# Patient Record
Sex: Female | Born: 2005 | Race: Black or African American | Hispanic: No | Marital: Single | Smoking: Never smoker
Health system: Southern US, Community
[De-identification: ages and names within clinical notes are randomized; demographics above are authoritative.]

## PROBLEM LIST (undated history)

## (undated) DIAGNOSIS — J45909 Unspecified asthma, uncomplicated: Secondary | ICD-10-CM

## (undated) HISTORY — DX: Unspecified asthma, uncomplicated: J45.909

## (undated) HISTORY — PX: TONSILLECTOMY: SUR1361

---
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
## 2023-01-02 ENCOUNTER — Emergency Department (HOSPITAL_BASED_OUTPATIENT_CLINIC_OR_DEPARTMENT_OTHER)
Admission: EM | Admit: 2023-01-02 | Discharge: 2023-01-02 | Disposition: A | Discharge status: Home / Self Care | Payer: Medicaid Other | Attending: Emergency Medicine | Admitting: Emergency Medicine

## 2023-01-02 ENCOUNTER — Encounter (HOSPITAL_BASED_OUTPATIENT_CLINIC_OR_DEPARTMENT_OTHER): Payer: Self-pay

## 2023-01-02 ENCOUNTER — Other Ambulatory Visit: Payer: Self-pay

## 2023-01-02 DIAGNOSIS — R109 Unspecified abdominal pain: Secondary | ICD-10-CM | POA: Insufficient documentation

## 2023-01-02 DIAGNOSIS — R000 Tachycardia, unspecified: Secondary | ICD-10-CM | POA: Insufficient documentation

## 2023-01-02 DIAGNOSIS — Z049 Encounter for examination and observation for unspecified reason: Secondary | ICD-10-CM

## 2023-01-02 DIAGNOSIS — Z0389 Encounter for observation for other suspected diseases and conditions ruled out: Secondary | ICD-10-CM | POA: Diagnosis not present

## 2023-01-02 LAB — CBC WITH DIFFERENTIAL/PLATELET
Abs Immature Granulocytes: 0.02 10*3/uL (ref 0.00–0.07)
Basophils Absolute: 0 10*3/uL (ref 0.0–0.1)
Basophils Relative: 0 %
Eosinophils Absolute: 0.1 10*3/uL (ref 0.0–0.5)
Eosinophils Relative: 1 %
HCT: 38.8 % (ref 36.0–46.0)
Hemoglobin: 12.9 g/dL (ref 12.0–15.0)
Immature Granulocytes: 0 %
Lymphocytes Relative: 25 %
Lymphs Abs: 1.8 10*3/uL (ref 0.7–4.0)
MCH: 26.8 pg (ref 26.0–34.0)
MCHC: 33.2 g/dL (ref 30.0–36.0)
MCV: 80.7 fL (ref 80.0–100.0)
Monocytes Absolute: 0.7 10*3/uL (ref 0.1–1.0)
Monocytes Relative: 10 %
Neutro Abs: 4.5 10*3/uL (ref 1.7–7.7)
Neutrophils Relative %: 64 %
Platelets: 273 10*3/uL (ref 150–400)
RBC: 4.81 MIL/uL (ref 3.87–5.11)
RDW: 13.7 % (ref 11.5–15.5)
WBC: 7.2 10*3/uL (ref 4.0–10.5)
nRBC: 0 % (ref 0.0–0.2)

## 2023-01-02 NOTE — Discharge Instructions (Addendum)
There was no evidence of any elevated antiparasitic white blood cells in your blood which significantly lowers my suspicion that you have an active parasite infection.  If you continue to have any concern I would try to obtain a stool sample for testing, you can return to the emergency department with stool sample or follow-up with your primary care doctor

## 2023-01-02 NOTE — ED Provider Notes (Signed)
[facility] Provider Note   CSN: [id number] Arrival date & time: 01/02/23  1518     History  Chief Complaint  Patient presents with   Foreign Body    Janet Mcconnell is a 17 y.o. female with noncontributory past medical history presents with concern for possible worm in her stool.  Patient has a photo on her phone of stool with a small linear mass that patient reports was suspicious for a parasite, patient denies any travel to [place] country.  She reports she did have sushi but it has been around a month.  She reports that she occasionally has some abdominal cramping but no severe abdominal pain.   Foreign Body      Home Medications Prior to Admission medications   Medication Sig Start Date End Date Taking? Authorizing Provider  albuterol (VENTOLIN HFA) 108 (90 Base) MCG/ACT inhaler ProAir HFA 90 mcg/actuation aerosol inhaler  2 puffs via inhalation 15-30 minutes prior to exercise and q4-6H PRN for wheezing, asthma symptoms.    [provider]  medroxyPROGESTERone (DEPO-PROVERA) 150 MG/ML injection Inject 1 mL (150 mg total) into the muscle every 3 (three) months. 12/07/22   Shelly Bombard, MD  medroxyPROGESTERone Acetate 150 MG/ML SUSY INJECT 1ML INTRAMUSCULARLY ONCE EVERY 3 MONTHS 03/22/22   Gavin Pound, CNM  Prenat-Fe Poly-Methfol-FA-DHA (VITAFOL ULTRA) 29-0.6-0.4-200 MG CAPS Take 1 capsule by mouth daily before breakfast. 12/07/22   Shelly Bombard, MD  triamcinolone cream (KENALOG) 0.1 % triamcinolone acetonide 0.1 % topical cream    [provider]      Allergies    Patient has no known allergies.    Review of Systems   Review of Systems  All other systems reviewed and are negative.   Physical Exam Updated Vital Signs BP 124/72   Pulse (!) 107   Temp 98 F (36.7 C)   Resp 18   Ht 5\' 6"  (1.676 m)   Wt 49.9 kg   LMP 12/05/2022 (Exact Date)   SpO2 100%   BMI 17.75 kg/m  Physical Exam Vitals  and nursing note reviewed.  Constitutional:      General: She is not in acute distress.    Appearance: Normal appearance.  HENT:     Head: Normocephalic and atraumatic.  Eyes:     General:        Right eye: No discharge.        Left eye: No discharge.  Cardiovascular:     Rate and Rhythm: Regular rhythm. Tachycardia present.     Heart sounds: No murmur heard.    No friction rub. No gallop.  Pulmonary:     Effort: Pulmonary effort is normal.     Breath sounds: Normal breath sounds.  Abdominal:     General: Bowel sounds are normal.     Palpations: Abdomen is soft.  Skin:    General: Skin is warm and dry.     Capillary Refill: Capillary refill takes less than 2 seconds.  Neurological:     Mental Status: She is alert and oriented to person, place, and time.  Psychiatric:        Mood and Affect: Mood normal.        Behavior: Behavior normal.     ED Results / Procedures / Treatments   Labs (all labs ordered are listed, but only abnormal results are displayed) Labs Reviewed  OVA + PARASITE EXAM  CBC WITH DIFFERENTIAL/PLATELET    EKG None  Radiology  No results found.  Procedures Procedures    Medications Ordered in ED Medications - No data to display  ED Course/ Medical Decision Making/ A&P                             Medical Decision Making Amount and/or Complexity of Data Reviewed Labs: ordered.   This patient is a 17 y.o. female who presents to the ED for concern of worms in stool.   Differential diagnoses prior to evaluation: Normal appearance of stool within variation of regular production,New parasitic infection, versus other intra-abdominal infection  Past Medical History / Social History / Additional history: Chart reviewed. Pertinent results include: Patient without any recent foreign travel, reports remote history of eating sushi around 1 month ago  Physical Exam: Physical exam performed. The pertinent findings include: Patient mildly  tachycardic on arrival, improved on my reevaluation, repeat heart rate 89.  Normal rhythm.  Patient was not able to provide a stool sample in the emergency department.  Independently interpreted CBC with differential which shows normal eosinophil count  Medications / Treatment: Discussed with patient that I have an overall very low clinical suspicion that she has a new parasitic infection based only on the appearance of her stool that she has very little to no risk factors for developing 1, she has no eosinophilia  Disposition: After consideration of the diagnostic results and the patients response to treatment, I feel that patient does not require acute treatment with albendazole, encouraged her to follow-up with her PCP or return if she has ongoing suspicion for parasitic infection of the stool with a stool sample, but would recommend reassurance, and no treatment at this time.   emergency department workup does not suggest an emergent condition requiring admission or immediate intervention beyond what has been performed at this time. The plan is: as above. The patient is safe for discharge and has been instructed to return immediately for worsening symptoms, change in symptoms or any other concerns.  Final Clinical Impression(s) / ED Diagnoses Final diagnoses:  Suspected condition not found    Rx / DC Orders ED Discharge Orders     None         Dorien Chihuahua 01/02/23 1702    Hayden Rasmussen, MD 01/03/23 1005

## 2023-01-02 NOTE — ED Notes (Signed)
Patient unable to give stool sample at this time. PA, Christian aware.

## 2023-01-02 NOTE — ED Triage Notes (Signed)
Patient here POV from Home.  Endorses Today at 1300 she used the BR and had a regular BM when she noted a worm.  Some nausea in the AM at times. No Emesis or Diarrhea.   NAD Noted during Triage. A&Ox4. GCS 15. Ambulatory.

## 2023-01-02 NOTE — ED Notes (Signed)
Reviewed AVS/discharge instruction with patient. Time allotted for and all questions answered. Patient is agreeable for d/c and escorted to ed exit by staff.  

## 2023-01-17 ENCOUNTER — Ambulatory Visit: Payer: Medicaid Other | Admitting: Nurse Practitioner

## 2023-02-28 ENCOUNTER — Ambulatory Visit (INDEPENDENT_AMBULATORY_CARE_PROVIDER_SITE_OTHER): Payer: Medicaid Other

## 2023-02-28 DIAGNOSIS — Z1339 Encounter for screening examination for other mental health and behavioral disorders: Secondary | ICD-10-CM | POA: Diagnosis not present

## 2023-02-28 DIAGNOSIS — Z3042 Encounter for surveillance of injectable contraceptive: Secondary | ICD-10-CM | POA: Diagnosis not present

## 2023-02-28 MED ORDER — MEDROXYPROGESTERONE ACETATE 150 MG/ML IM SUSY
150.0000 mg | PREFILLED_SYRINGE | Freq: Once | INTRAMUSCULAR | Status: AC
  Administered 2023-02-28: 150 mg via INTRAMUSCULAR

## 2023-02-28 NOTE — Progress Notes (Signed)
Date last pap: N/A. Last Depo-Provera: 12/07/22. Side Effects if any: N/A. Serum HCG indicated? N/A. Depo-Provera 150 mg IM given by: Samantha Olivera B.,RN. Injection given in RUOQ. Patient tolerated well. Next appointment due July 31- Aug 14.   Office supply given. Pharmacy on back order

## 2023-03-06 ENCOUNTER — Ambulatory Visit (INDEPENDENT_AMBULATORY_CARE_PROVIDER_SITE_OTHER): Payer: Medicaid Other | Admitting: Licensed Clinical Social Worker

## 2023-03-06 DIAGNOSIS — F419 Anxiety disorder, unspecified: Secondary | ICD-10-CM | POA: Diagnosis not present

## 2023-03-08 NOTE — BH Specialist Note (Signed)
Integrated Behavioral Health via Telemedicine Visit  03/08/2023 Janet Mcconnell [id number]  Number of Integrated Behavioral Health Clinician visits:1 Session Start time:  1030am Session End time: 1106am Total time in minutes: 36 mins via mychart video   Referring Provider: Rozanna Box RN Patient/Family location: Home  [facility] Provider location: [facility]  All persons participating in visit: Pt Janet Mcconnell and LCSW A Mumtaz Lovins  Types of Service: Individual psychotherapy and Video visit  I connected with Janet Mcconnell and/or Janet Mcconnell'Janet n/a via  Telephone or Video Enabled Telemedicine Application  (Video is Caregility application) and verified that I am speaking with the correct person using two identifiers. Discussed confidentiality: Yes   I discussed the limitations of telemedicine and the availability of in person appointments.  Discussed there is a possibility of technology failure and discussed alternative modes of communication if that failure occurs.  I discussed that engaging in this telemedicine visit, they consent to the provision of behavioral healthcare and the services will be billed under their insurance.  Patient and/or legal guardian expressed understanding and consented to Telemedicine visit: Yes   Presenting Concerns: Patient and/or family reports the following symptoms/concerns: anxiety Duration of problem: approx one year; Severity of problem: mild  Patient and/or Family'Janet Strengths/Protective Factors: Concrete supports in place (healthy food, safe environments, etc.)  Goals Addressed: Patient will:  Reduce symptoms of: anxiety   Increase knowledge and/or ability of: coping skills   Demonstrate ability to: Increase healthy adjustment to current life circumstances  Progress towards Goals: Ongoing  Interventions: Interventions utilized:  Motivational Interviewing and Supportive Counseling Standardized Assessments completed: PHQ 9  Patient and/or Family Response:  Janet Mcconnell lives in [place] with mother and 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 Janet Mcconnell reports feeling overwhelmed, crying, overthinking, negative thought patterns. LCSW A Felton Clinton and Janet Lyall discussed mindfulness, CBT and self care techniques to alleviate mentioned anxiety symptoms.  Assessment: Patient currently experiencing anxiety.   Patient may benefit from integrated behavioral health.  Plan: Follow up with behavioral health clinician on : two weeks via mychart  Behavioral recommendations: Mindfulness, self care techniques, prioritize task, decrease social media encounters.  Referral(Janet): Integrated [employer] (In Clinic)  I discussed the assessment and treatment plan with the patient and/or parent/guardian. They were provided an opportunity to ask questions and all were answered. They agreed with the plan and demonstrated an understanding of the instructions.   They were advised to call back or seek an in-person evaluation if the symptoms worsen or if the condition fails to improve as anticipated.  Gwyndolyn Saxon, LCSW

## 2023-03-27 ENCOUNTER — Encounter: Payer: Self-pay | Admitting: Licensed Clinical Social Worker

## 2023-03-30 ENCOUNTER — Encounter: Payer: Medicaid Other | Admitting: Licensed Clinical Social Worker

## 2023-04-03 ENCOUNTER — Ambulatory Visit (INDEPENDENT_AMBULATORY_CARE_PROVIDER_SITE_OTHER): Payer: Medicaid Other | Admitting: Licensed Clinical Social Worker

## 2023-04-03 DIAGNOSIS — F419 Anxiety disorder, unspecified: Secondary | ICD-10-CM | POA: Diagnosis not present

## 2023-04-10 NOTE — BH Specialist Note (Signed)
Integrated Behavioral Health via Telemedicine Visit  04/10/2023 Janet Mcconnell [id number]  Number of Integrated Behavioral Health Clinician visits: 2 Session Start time:  1045am Session End time: 11:10am Total time in minutes: 25 mins via mychart video   Referring Provider: Rozanna Box RN Patient/Family location: Home  [facility] Provider location: [facility]  All persons participating in visit: Janet Mcconnell and LCSW A Hilmer Aliberti  Types of Service: Individual psychotherapy and Video visit  I connected with Janet Mcconnell and/or Janet Mcconnell's n/a via  Telephone or Video Enabled Telemedicine Application  (Video is Caregility application) and verified that I am speaking with the correct person using two identifiers. Discussed confidentiality: Yes   I discussed the limitations of telemedicine and the availability of in person appointments.  Discussed there is a possibility of technology failure and discussed alternative modes of communication if that failure occurs.  I discussed that engaging in this telemedicine visit, they consent to the provision of behavioral healthcare and the services will be billed under their insurance.  Patient and/or legal guardian expressed understanding and consented to Telemedicine visit: Yes   Presenting Concerns: Patient and/or family reports the following symptoms/concerns: anxiety Duration of problem: approximately one year ; Severity of problem: mild  Patient and/or Family's Strengths/Protective Factors: Concrete supports in place (healthy food, safe environments, etc.)  Goals Addressed: Patient will:  Reduce symptoms of: anxiety   Increase knowledge and/or ability of: coping skills   Demonstrate ability to: Increase healthy adjustment to current life circumstances  Progress towards Goals: Ongoing  Interventions: Interventions utilized:  Supportive Counseling Standardized Assessments completed: Not Needed  Patient and/or Family Response: Janet Mcconnell  reports improvement in mood and counteracting negative thought patterns. [name] reports family is supportive and her immediate needs are met. Janet Mcconnell will continue demonstrated coping skills such goal setting, self care and prioritizing task   Assessment: Patient currently experiencing anxiety.   Patient may benefit from integrated behavioral health.  Plan: Follow up with behavioral health clinician on : as needed  Behavioral recommendations: continue implementing coping skills and monitor current stressors.  Referral(s): Integrated [employer] (In Clinic)  I discussed the assessment and treatment plan with the patient and/or parent/guardian. They were provided an opportunity to ask questions and all were answered. They agreed with the plan and demonstrated an understanding of the instructions.   They were advised to call back or seek an in-person evaluation if the symptoms worsen or if the condition fails to improve as anticipated.  Gwyndolyn Saxon, LCSW

## 2023-05-19 ENCOUNTER — Ambulatory Visit: Payer: Self-pay

## 2023-05-29 ENCOUNTER — Ambulatory Visit: Payer: Medicaid Other | Admitting: Emergency Medicine

## 2023-05-29 VITALS — BP 114/71 | HR 96 | Ht 66.0 in | Wt 114.0 lb

## 2023-05-29 DIAGNOSIS — Z3042 Encounter for surveillance of injectable contraceptive: Secondary | ICD-10-CM

## 2023-05-29 MED ORDER — MEDROXYPROGESTERONE ACETATE 150 MG/ML IM SUSY
150.0000 mg | PREFILLED_SYRINGE | Freq: Once | INTRAMUSCULAR | Status: AC
  Administered 2023-05-29: 150 mg via INTRAMUSCULAR

## 2023-05-29 NOTE — Progress Notes (Signed)
Date last pap: NA. Last Depo-Provera: 02/28/23 Side Effects if any: NONE Serum HCG indicated? NO. Depo-Provera 150 mg IM given by: Resa Miner, RN into RD, tolerated well in office. Next appointment due: Oct 28-Nov 7

## 2023-06-20 ENCOUNTER — Ambulatory Visit: Payer: Self-pay | Admitting: Obstetrics and Gynecology

## 2023-08-14 ENCOUNTER — Other Ambulatory Visit: Payer: Self-pay

## 2023-08-14 ENCOUNTER — Ambulatory Visit (INDEPENDENT_AMBULATORY_CARE_PROVIDER_SITE_OTHER): Payer: Medicaid Other | Admitting: Emergency Medicine

## 2023-08-14 VITALS — BP 119/74 | HR 102 | Ht 66.0 in | Wt 109.0 lb

## 2023-08-14 DIAGNOSIS — Z3042 Encounter for surveillance of injectable contraceptive: Secondary | ICD-10-CM

## 2023-08-14 MED ORDER — MEDROXYPROGESTERONE ACETATE 150 MG/ML IM SUSY: PREFILLED_SYRINGE | INTRAMUSCULAR | 1 refills | Status: DC

## 2023-08-14 MED ORDER — MEDROXYPROGESTERONE ACETATE 150 MG/ML IM SUSP
150.0000 mg | Freq: Once | INTRAMUSCULAR | Status: AC
  Administered 2023-08-14: 150 mg via INTRAMUSCULAR

## 2023-08-14 NOTE — Progress Notes (Signed)
Date last pap: NA. Last Depo-Provera: 05/29/2023. Side Effects if any: Breakthrough bleeding. Serum HCG indicated? NA. Depo-Provera 150 mg IM given by: Resa Miner, RN into, tolerated well in office.Marland Kitchen Next appointment due Jan 13-Jan 27.

## 2023-08-26 ENCOUNTER — Ambulatory Visit (HOSPITAL_COMMUNITY)
Admission: EM | Admit: 2023-08-26 | Discharge: 2023-08-26 | Disposition: A | Discharge status: Home / Self Care | Payer: Medicaid Other

## 2023-08-26 ENCOUNTER — Encounter (HOSPITAL_COMMUNITY): Payer: Self-pay

## 2023-08-26 DIAGNOSIS — J40 Bronchitis, not specified as acute or chronic: Secondary | ICD-10-CM

## 2023-08-26 DIAGNOSIS — J4521 Mild intermittent asthma with (acute) exacerbation: Secondary | ICD-10-CM

## 2023-08-26 MED ORDER — AZITHROMYCIN 250 MG PO TABS: ORAL_TABLET | ORAL | 0 refills | Status: AC

## 2023-08-26 MED ORDER — PREDNISONE 10 MG PO TABS: ORAL_TABLET | ORAL | 0 refills | Status: AC

## 2023-08-26 MED ORDER — CETIRIZINE HCL 10 MG PO TABS: 10.0000 mg | ORAL_TABLET | Freq: Every day | ORAL | 0 refills | Status: AC

## 2023-08-26 NOTE — ED Provider Notes (Signed)
[facility]    CSN: [id number] Arrival date & time: 08/26/23  1152      History   Chief Complaint Chief Complaint  Patient presents with   Cough   Nasal Congestion   Headache   Insect Bite    HPI Janet Mcconnell is a 17 y.o. female.   Patient has been sick x 2 weeks.  She initially had fever body aches she was getting better develop sinus symptoms which is now resolving but now has a constant cough that is productive.  She has been taking various over-the-counter medications to include Tylenol cold and sinus, Alka-Seltzer plus, NyQuil and DayQuil.  She does have a history of intermittent asthma.  She states that she is increased her albuterol inhaler but it has not been effective for the cough.   Cough Associated symptoms: headaches and sore throat   Headache Associated symptoms: congestion, cough, drainage and sore throat     Past Medical History:  Diagnosis Date   Asthma     Patient Active Problem List   Diagnosis Date Noted   Depo-Provera contraceptive status 03/22/2021    Past Surgical History:  Procedure Laterality Date   TONSILLECTOMY      OB History     Gravida  0   Para  0   Term  0   Preterm  0   AB  0   Living  0      SAB  0   IAB  0   Ectopic  0   Multiple  0   Live Births  0            Home Medications    Prior to Admission medications   Medication Sig Start Date End Date Taking? Authorizing Provider  albuterol (VENTOLIN HFA) 108 (90 Base) MCG/ACT inhaler Inhale 2 puffs into the lungs every 4 (four) hours as needed for wheezing (Coughing/Wheezing). Last used: [id number]   Yes [provider]  azithromycin (ZITHROMAX Z-PAK) 250 MG tablet Take 2 tabs on day one and then 1 tab on for day 2-5. 08/26/23  Yes Shainna Faux, Linde Gillis, NP  fluticasone (FLONASE) 50 MCG/ACT nasal spray Place 1 spray into both nostrils daily. 03/21/23  Yes [provider]  medroxyPROGESTERone Acetate 150 MG/ML SUSY INJECT  INTRAMUSCULARLY ONCE EVERY 3 MONTHS 08/14/23  Yes Constant, Peggy, MD  predniSONE (DELTASONE) 10 MG tablet 2 tabs x 4 days and then 1 tab x 3 days. 08/26/23  Yes Shemaiah Round, Linde Gillis, NP  Prenat-Fe Poly-Methfol-FA-DHA (VITAFOL ULTRA) 29-0.6-0.4-200 MG CAPS Take 1 capsule by mouth daily before breakfast. 12/07/22  Yes Brock Bad, MD  triamcinolone cream (KENALOG) 0.1 % Apply 1 Application topically 2 (two) times daily.   Yes [provider]  cetirizine (ZYRTEC) 10 MG tablet Take 1 tablet (10 mg total) by mouth daily. 08/26/23   Eletha Culbertson, Linde Gillis, NP    Family History History reviewed. No pertinent family history.  Social History Social History   Tobacco Use   Smoking status: Never   Smokeless tobacco: Never  Vaping Use   Vaping status: Former   Substances: Nicotine  Substance Use Topics   Alcohol use: Yes    Comment: rarely   Drug use: Not Currently     Allergies   Patient has no known allergies.   Review of Systems Review of Systems  HENT:  Positive for congestion, postnasal drip and sore throat.   Respiratory:  Positive for cough.   Neurological:  Positive for  headaches.  All other systems reviewed and are negative.    Physical Exam Triage Vital Signs ED Triage Vitals  Encounter Vitals Group     BP 08/26/23 1249 119/80     Systolic BP Percentile --      Diastolic BP Percentile --      Pulse Rate 08/26/23 1249 80     Resp 08/26/23 1249 18     Temp 08/26/23 1249 99.1 F (37.3 C)     Temp Source 08/26/23 1249 Oral     SpO2 08/26/23 1249 99 %     Weight 08/26/23 1247 109 lb (49.4 kg)     Height 08/26/23 1247 5\' 6"  (1.676 m)     Head Circumference --      Peak Flow --      Pain Score 08/26/23 1243 3     Pain Loc --      Pain Education --      Exclude from Growth Chart --    No data found.  Updated Vital Signs BP 119/80 (BP Location: Right Arm)   Pulse 80   Temp 99.1 F (37.3 C) (Oral)   Resp 18   Ht 5\' 6"  (1.676 m)   Wt 109 lb (49.4 kg)   LMP   (LMP Unknown)   SpO2 99%   BMI 17.59 kg/m   Visual Acuity Right Eye Distance:   Left Eye Distance:   Bilateral Distance:    Right Eye Near:   Left Eye Near:    Bilateral Near:     Physical Exam HENT:     Nose: Congestion present.     Right Turbinates: Swollen.     Left Turbinates: Swollen.     Mouth/Throat:     Mouth: Mucous membranes are moist.  Cardiovascular:     Rate and Rhythm: Normal rate and regular rhythm.     Heart sounds: Normal heart sounds.  Pulmonary:     Breath sounds: Wheezing present.  Lymphadenopathy:     Cervical: Cervical adenopathy present.  Neurological:     Mental Status: She is alert.      UC Treatments / Results  Labs (all labs ordered are listed, but only abnormal results are displayed) Labs Reviewed - No data to display  EKG   Radiology No results found.  Procedures Procedures (including critical care time)  Medications Ordered in UC Medications - No data to display  Initial Impression / Assessment and Plan / UC Course  I have reviewed the triage vital signs and the nursing notes.  Pertinent labs & imaging results that were available during my care of the patient were reviewed by me and considered in my medical decision making (see chart for details).   Patient has findings of a resolving upper respiratory virus.  However, she is an asthmatic and it appears she is having an exacerbation with possible lower respiratory infection vs bronchitis.  We will treat with azithromycin, prednisone, and Zyrtec.  She is to continue her Flonase which she has.  Recommend follow-up with her PCP or pulmonologist for asthma evaluation.   Final Clinical Impressions(s) / UC Diagnoses   Final diagnoses:  Mild intermittent asthma with acute exacerbation  Bronchitis     Discharge Instructions      Take antibiotic as ordered until complete. Prednisone x 7 days.  Follow directions on label. Zyrtec daily for at least 2 weeks. Flonase daily for  at least 2 weeks. Follow-up with primary care or pulmonologist for asthma exacerbation/bronchitis.  ED Prescriptions     Medication Sig Dispense Auth. Provider   cetirizine (ZYRTEC) 10 MG tablet Take 1 tablet (10 mg total) by mouth daily. 30 tablet Marquice Uddin, Linde Gillis, NP   azithromycin (ZITHROMAX Z-PAK) 250 MG tablet Take 2 tabs on day one and then 1 tab on for day 2-5. 6 tablet Shenell Rogalski, Linde Gillis, NP   predniSONE (DELTASONE) 10 MG tablet 2 tabs x 4 days and then 1 tab x 3 days. 11 tablet Kamarie Veno, Linde Gillis, NP      PDMP not reviewed this encounter.   Nelda Marseille, NP 08/26/23 1319

## 2023-08-26 NOTE — ED Triage Notes (Signed)
"  I have been having this Cough with congestion about 2 wks now, it is now waking me up in my sleep, otc medicine helped at first but are not now". "I am having a headache with sinus pain/pressure too". No fever.  "I also got stung by something on my left shoulder this morning". No sob. No respiratory distress. No hives.

## 2023-08-26 NOTE — Discharge Instructions (Addendum)
Take antibiotic as ordered until complete. Prednisone x 7 days.  Follow directions on label. Zyrtec daily for at least 2 weeks. Flonase daily for at least 2 weeks. Follow-up with primary care or pulmonologist for asthma exacerbation/bronchitis.

## 2023-09-04 ENCOUNTER — Ambulatory Visit: Payer: Medicaid Other

## 2023-09-04 ENCOUNTER — Other Ambulatory Visit (HOSPITAL_COMMUNITY)
Admission: RE | Admit: 2023-09-04 | Discharge: 2023-09-04 | Disposition: A | Discharge status: Home / Self Care | Payer: Medicaid Other | Source: Ambulatory Visit | Attending: Obstetrics and Gynecology | Admitting: Obstetrics and Gynecology

## 2023-09-04 VITALS — BP 129/74 | HR 98 | Wt 117.0 lb

## 2023-09-04 DIAGNOSIS — N898 Other specified noninflammatory disorders of vagina: Secondary | ICD-10-CM

## 2023-09-04 DIAGNOSIS — R8290 Unspecified abnormal findings in urine: Secondary | ICD-10-CM

## 2023-09-04 NOTE — Progress Notes (Signed)
..  SUBJECTIVE:  17 y.o. female complains of  vaginal discharge, odor, and itching for 3-4 day(s). Denies abnormal vaginal bleeding or significant pelvic pain or fever. Possible odor with urination. Denies history of known exposure to STD.  No LMP recorded (lmp unknown). Patient has had an injection.  OBJECTIVE:  She appears well, afebrile.  ASSESSMENT:  Vaginal Discharge  Vaginal/urinary Odor   PLAN:  GC, chlamydia, trichomonas, BVAG, CVAG probe sent to lab. Treatment: To be determined once lab results are received ROV prn if symptoms persist or worsen.

## 2023-09-05 LAB — CERVICOVAGINAL ANCILLARY ONLY
Bacterial Vaginitis (gardnerella): NEGATIVE
Candida Glabrata: NEGATIVE
Candida Vaginitis: NEGATIVE
Chlamydia: NEGATIVE
Comment: NEGATIVE
Comment: NEGATIVE
Comment: NEGATIVE
Comment: NEGATIVE
Comment: NEGATIVE
Comment: NORMAL
Neisseria Gonorrhea: NEGATIVE
Trichomonas: NEGATIVE

## 2023-09-06 LAB — URINE CULTURE

## 2023-09-21 ENCOUNTER — Other Ambulatory Visit: Payer: Self-pay

## 2023-09-21 ENCOUNTER — Encounter (HOSPITAL_COMMUNITY): Payer: Self-pay | Admitting: *Deleted

## 2023-09-21 ENCOUNTER — Emergency Department (HOSPITAL_COMMUNITY)
Admission: EM | Admit: 2023-09-21 | Discharge: 2023-09-21 | Discharge status: Against Medical Advice | Payer: Medicaid Other | Attending: Emergency Medicine | Admitting: Emergency Medicine

## 2023-09-21 DIAGNOSIS — Z5321 Procedure and treatment not carried out due to patient leaving prior to being seen by health care provider: Secondary | ICD-10-CM | POA: Insufficient documentation

## 2023-09-21 DIAGNOSIS — R1084 Generalized abdominal pain: Secondary | ICD-10-CM | POA: Diagnosis not present

## 2023-09-21 DIAGNOSIS — R1110 Vomiting, unspecified: Secondary | ICD-10-CM | POA: Insufficient documentation

## 2023-09-21 LAB — COMPREHENSIVE METABOLIC PANEL
ALT: 97 U/L — ABNORMAL HIGH (ref 0–44)
AST: 68 U/L — ABNORMAL HIGH (ref 15–41)
Albumin: 4.4 g/dL (ref 3.5–5.0)
Alkaline Phosphatase: 41 U/L (ref 38–126)
Anion gap: 15 (ref 5–15)
BUN: 15 mg/dL (ref 6–20)
CO2: 19 mmol/L — ABNORMAL LOW (ref 22–32)
Calcium: 9.5 mg/dL (ref 8.9–10.3)
Chloride: 102 mmol/L (ref 98–111)
Creatinine, Ser: 0.73 mg/dL (ref 0.44–1.00)
GFR, Estimated: >60 mL/min (ref >60)
Glucose, Bld: 110 mg/dL — ABNORMAL HIGH (ref 70–99)
Potassium: 3.4 mmol/L — ABNORMAL LOW (ref 3.5–5.1)
Sodium: 136 mmol/L (ref 135–145)
Total Bilirubin: 1 mg/dL (ref <1.2)
Total Protein: 7.4 g/dL (ref 6.5–8.1)

## 2023-09-21 LAB — CBC
HCT: 40.8 % (ref 36.0–46.0)
Hemoglobin: 13.6 g/dL (ref 12.0–15.0)
MCH: 26 pg (ref 26.0–34.0)
MCHC: 33.3 g/dL (ref 30.0–36.0)
MCV: 78 fL — ABNORMAL LOW (ref 80.0–100.0)
Platelets: 317 10*3/uL (ref 150–400)
RBC: 5.23 MIL/uL — ABNORMAL HIGH (ref 3.87–5.11)
RDW: 13.9 % (ref 11.5–15.5)
WBC: 8.1 10*3/uL (ref 4.0–10.5)
nRBC: 0 % (ref 0.0–0.2)

## 2023-09-21 LAB — URINALYSIS, ROUTINE W REFLEX MICROSCOPIC
Bilirubin Urine: NEGATIVE
Glucose, UA: NEGATIVE mg/dL
Hgb urine dipstick: NEGATIVE
Ketones, ur: 5 mg/dL — AB
Leukocytes,Ua: NEGATIVE
Nitrite: NEGATIVE
Protein, ur: NEGATIVE mg/dL
Specific Gravity, Urine: 1.008 (ref 1.005–1.030)
pH: 8 (ref 5.0–8.0)

## 2023-09-21 LAB — HCG, SERUM, QUALITATIVE: Preg, Serum: NEGATIVE

## 2023-09-21 LAB — LIPASE, BLOOD: Lipase: 24 U/L (ref 11–51)

## 2023-09-21 MED ORDER — ONDANSETRON 4 MG PO TBDP
4.0000 mg | ORAL_TABLET | Freq: Once | ORAL | Status: AC | PRN
  Administered 2023-09-21: 4 mg via ORAL

## 2023-09-21 NOTE — ED Triage Notes (Signed)
Pt reports onset of vomiting around midnight, c/o generalized abdominal pain.

## 2023-09-21 NOTE — ED Notes (Signed)
Pt left building name was called x3 no answer

## 2023-11-01 ENCOUNTER — Other Ambulatory Visit (HOSPITAL_COMMUNITY)
Admission: RE | Admit: 2023-11-01 | Discharge: 2023-11-01 | Disposition: A | Discharge status: Home / Self Care | Payer: Medicaid Other | Source: Ambulatory Visit | Attending: Obstetrics & Gynecology | Admitting: Obstetrics & Gynecology

## 2023-11-01 ENCOUNTER — Ambulatory Visit: Payer: Medicaid Other | Admitting: Emergency Medicine

## 2023-11-01 VITALS — Wt 121.0 lb

## 2023-11-01 DIAGNOSIS — Z3202 Encounter for pregnancy test, result negative: Secondary | ICD-10-CM

## 2023-11-01 DIAGNOSIS — Z113 Encounter for screening for infections with a predominantly sexual mode of transmission: Secondary | ICD-10-CM

## 2023-11-01 DIAGNOSIS — Z3042 Encounter for surveillance of injectable contraceptive: Secondary | ICD-10-CM | POA: Diagnosis not present

## 2023-11-01 DIAGNOSIS — Z3200 Encounter for pregnancy test, result unknown: Secondary | ICD-10-CM

## 2023-11-01 LAB — POCT URINE PREGNANCY: Preg Test, Ur: NEGATIVE

## 2023-11-01 MED ORDER — MEDROXYPROGESTERONE ACETATE 150 MG/ML IM SUSP
150.0000 mg | Freq: Once | INTRAMUSCULAR | Status: AC
  Administered 2023-11-01: 150 mg via INTRAMUSCULAR

## 2023-11-01 MED ORDER — MEDROXYPROGESTERONE ACETATE 150 MG/ML IM SUSP: 150.0000 mg | INTRAMUSCULAR | 0 refills | Status: DC

## 2023-11-01 NOTE — Progress Notes (Signed)
Date last pap: NA. Last Depo-Provera: 08/13/24 Side Effects if any: NA. Serum HCG indicated? NA. Depo-Provera 150 mg IM given by: Resa Miner, RN into RUOQ, tolerated well. Next appointment due: Apr 2- Apr 16.  Patient also desires routine STD screen and blood work. Denies vaginal symptoms, or abdominal pain.

## 2023-11-02 LAB — CERVICOVAGINAL ANCILLARY ONLY
Bacterial Vaginitis (gardnerella): NEGATIVE
Candida Glabrata: NEGATIVE
Candida Vaginitis: NEGATIVE
Chlamydia: POSITIVE — AB
Comment: NEGATIVE
Comment: NEGATIVE
Comment: NEGATIVE
Comment: NEGATIVE
Comment: NEGATIVE
Comment: NORMAL
Neisseria Gonorrhea: NEGATIVE
Trichomonas: NEGATIVE

## 2023-11-03 LAB — HEPATITIS B SURFACE ANTIGEN: Hepatitis B Surface Ag: NEGATIVE

## 2023-11-03 LAB — RPR: RPR Ser Ql: NONREACTIVE

## 2023-11-03 LAB — HIV ANTIBODY (ROUTINE TESTING W REFLEX): HIV Screen 4th Generation wRfx: NONREACTIVE

## 2023-11-03 LAB — HEPATITIS C ANTIBODY: Hep C Virus Ab: NONREACTIVE

## 2023-11-07 ENCOUNTER — Other Ambulatory Visit: Payer: Self-pay | Admitting: *Deleted

## 2023-11-07 DIAGNOSIS — A749 Chlamydial infection, unspecified: Secondary | ICD-10-CM

## 2023-11-07 MED ORDER — DOXYCYCLINE HYCLATE 100 MG PO CAPS: 100.0000 mg | ORAL_CAPSULE | Freq: Two times a day (BID) | ORAL | 0 refills | Status: AC

## 2023-11-07 NOTE — Progress Notes (Signed)
Vaginal swab from 11/01/23 positive for Chlamydia. RX Doxycycline per protocol. MyChart message with education on infection, partner instructions and sex precautions sent.

## 2024-01-02 ENCOUNTER — Telehealth: Payer: Self-pay | Admitting: *Deleted

## 2024-01-02 NOTE — Telephone Encounter (Signed)
 Received phone message from pt reporting that she is stopping birth control. Reports she no longer needs an appt for Depo. No appointments currently scheduled.

## 2024-02-12 ENCOUNTER — Ambulatory Visit

## 2024-02-19 ENCOUNTER — Other Ambulatory Visit (HOSPITAL_COMMUNITY)
Admission: RE | Admit: 2024-02-19 | Discharge: 2024-02-19 | Disposition: A | Discharge status: Home / Self Care | Source: Ambulatory Visit | Attending: Obstetrics and Gynecology | Admitting: Obstetrics and Gynecology

## 2024-02-19 ENCOUNTER — Ambulatory Visit

## 2024-02-19 VITALS — BP 113/78 | HR 79

## 2024-02-19 DIAGNOSIS — Z113 Encounter for screening for infections with a predominantly sexual mode of transmission: Secondary | ICD-10-CM

## 2024-02-19 MED ORDER — FLUCONAZOLE 150 MG PO TABS: 150.0000 mg | ORAL_TABLET | Freq: Once | ORAL | 0 refills | Status: AC

## 2024-02-19 NOTE — Progress Notes (Signed)
 SUBJECTIVE:  18 y.o. female who desires a STI screen. Denies abnormal vaginal discharge, bleeding or significant pelvic pain. No UTI symptoms. Denies history of known exposure to STD. Pt currently on abx for swollen lymph node per pt, and requesting diflucan since she always gets a yeast infection after abx. Rx sent.  No LMP recorded. Patient has had an injection.  OBJECTIVE:  She appears well.   ASSESSMENT:  STI Screen   PLAN:  Pt offered STI blood screening-requested GC, chlamydia, and trichomonas probe sent to lab.  Treatment: To be determined once lab results are received.  Pt follow up as needed.

## 2024-02-20 LAB — CERVICOVAGINAL ANCILLARY ONLY
Bacterial Vaginitis (gardnerella): NEGATIVE
Candida Glabrata: NEGATIVE
Candida Vaginitis: NEGATIVE
Chlamydia: NEGATIVE
Comment: NEGATIVE
Comment: NEGATIVE
Comment: NEGATIVE
Comment: NEGATIVE
Comment: NEGATIVE
Comment: NORMAL
Neisseria Gonorrhea: NEGATIVE
Trichomonas: NEGATIVE

## 2024-02-21 LAB — RPR: RPR Ser Ql: NONREACTIVE

## 2024-02-21 LAB — HEPATITIS C ANTIBODY: Hep C Virus Ab: NONREACTIVE

## 2024-02-21 LAB — HEPATITIS B SURFACE ANTIGEN: Hepatitis B Surface Ag: NEGATIVE

## 2024-02-21 LAB — HIV ANTIBODY (ROUTINE TESTING W REFLEX): HIV Screen 4th Generation wRfx: NONREACTIVE

## 2024-04-10 ENCOUNTER — Telehealth: Payer: Self-pay

## 2024-04-11 NOTE — Telephone Encounter (Signed)
 VM from patient requesting an appointment be scheduled anytime for Monday with Dr. Rudy. She states that if you try to call back, she will likely not answer d/t being at work. She requests that appointment go ahead and be scheduled.

## 2024-04-15 ENCOUNTER — Ambulatory Visit (INDEPENDENT_AMBULATORY_CARE_PROVIDER_SITE_OTHER): Admitting: Obstetrics

## 2024-04-15 ENCOUNTER — Encounter: Payer: Self-pay | Admitting: Obstetrics

## 2024-04-15 VITALS — BP 110/74 | HR 93 | Ht 66.0 in | Wt 125.0 lb

## 2024-04-15 DIAGNOSIS — Z30011 Encounter for initial prescription of contraceptive pills: Secondary | ICD-10-CM

## 2024-04-15 DIAGNOSIS — Z3009 Encounter for other general counseling and advice on contraception: Secondary | ICD-10-CM

## 2024-04-15 DIAGNOSIS — N939 Abnormal uterine and vaginal bleeding, unspecified: Secondary | ICD-10-CM | POA: Diagnosis not present

## 2024-04-15 DIAGNOSIS — A749 Chlamydial infection, unspecified: Secondary | ICD-10-CM | POA: Diagnosis not present

## 2024-04-15 DIAGNOSIS — E569 Vitamin deficiency, unspecified: Secondary | ICD-10-CM

## 2024-04-15 MED ORDER — VITAFOL ULTRA 29-0.6-0.4-200 MG PO CAPS: 1.0000 | ORAL_CAPSULE | Freq: Every day | ORAL | 4 refills | Status: DC

## 2024-04-15 MED ORDER — NORETHIN-ETH ESTRAD TRIPHASIC 0.5/0.75/1-35 MG-MCG PO TABS: 1.0000 | ORAL_TABLET | Freq: Every day | ORAL | 11 refills | Status: AC

## 2024-04-15 NOTE — Progress Notes (Signed)
 Pt presents for bleeding, constantly, 01/29/24. Lightheaded, black vision when standing, weaker, worsening.Decreased energy DC'd birth control. Last depo in January.

## 2024-04-15 NOTE — Progress Notes (Signed)
 Patient ID: Janet Mcconnell, female   DOB: 2006-04-27, 18 y.o.   MRN: [id number]  Chief Complaint  Patient presents with   Vaginal Bleeding    HPI Janet Mcconnell is a 18 y.o. female.  Heavy vaginal bleeding since stopping Depo in April 2025.  Has been feeling weak and fatigued. HPI  Past Medical History:  Diagnosis Date   Asthma     Past Surgical History:  Procedure Laterality Date   TONSILLECTOMY      History reviewed. No pertinent family history.  Social History Social History   Tobacco Use   Smoking status: Never   Smokeless tobacco: Never  Vaping Use   Vaping status: Former   Substances: Nicotine  Substance Use Topics   Alcohol use: Yes    Comment: rarely   Drug use: Not Currently    No Known Allergies  Current Outpatient Medications  Medication Sig Dispense Refill   albuterol (VENTOLIN HFA) 108 (90 Base) MCG/ACT inhaler Inhale 2 puffs into the lungs every 4 (four) hours as needed for wheezing (Coughing/Wheezing). Last used: [id number]     cetirizine  (ZYRTEC ) 10 MG tablet Take 1 tablet (10 mg total) by mouth daily. 30 tablet 0   norethindrone-ethinyl estradiol (CYCLAFEM) 0.5/0.75/1-35 MG-MCG tablet Take 1 tablet by mouth daily. 28 tablet 11   azithromycin  (ZITHROMAX  Z-PAK) 250 MG tablet Take 2 tabs on day one and then 1 tab on for day 2-5. (Patient not taking: Reported on 04/15/2024) 6 tablet 0   fluticasone (FLONASE) 50 MCG/ACT nasal spray Place 1 spray into both nostrils daily. (Patient not taking: Reported on 04/15/2024)     predniSONE  (DELTASONE ) 10 MG tablet 2 tabs x 4 days and then 1 tab x 3 days. (Patient not taking: Reported on 04/15/2024) 11 tablet 0   Prenat-Fe Poly-Methfol-FA-DHA (VITAFOL  ULTRA) 29-0.6-0.4-200 MG CAPS Take 1 capsule by mouth daily before breakfast. 90 capsule 4   triamcinolone cream (KENALOG) 0.1 % Apply 1 Application topically 2 (two) times daily. (Patient not taking: Reported on 04/15/2024)     No current facility-administered medications  for this visit.    Review of Systems Review of Systems Constitutional: negative for fatigue and weight loss Respiratory: negative for cough and wheezing Cardiovascular: negative for chest pain, fatigue and palpitations Gastrointestinal: negative for abdominal pain and change in bowel habits Genitourinary: positive for heavy vaginal bleeding Integument/breast: negative for nipple discharge Musculoskeletal:negative for myalgias Neurological: negative for gait problems and tremors Behavioral/Psych: negative for abusive relationship, depression Endocrine: negative for temperature intolerance      Blood pressure 110/74, pulse 93, height 5' 6 (1.676 m), weight 125 lb (56.7 kg), last menstrual period 01/29/2024.  Physical Exam Physical Exam General:   Alert and no distress  Skin:   no rash or abnormalities  Lungs:   clear to auscultation bilaterally  Heart:   regular rate and rhythm, S1, S2 normal, no murmur, click, rub or gallop  Breasts:   normal without suspicious masses, skin or nipple changes or axillary nodes  Abdomen:  normal findings: no organomegaly, soft, non-tender and no hernia  Pelvis:  External genitalia: normal general appearance Urinary system: urethral meatus normal and bladder without fullness, nontender Vaginal: normal without tenderness, induration or masses Cervix: normal appearance Adnexa: normal bimanual exam Uterus: anteverted and non-tender, normal size    I have spent a total of 20 minutes of face-to-face time, excluding clinical staff time, reviewing notes and preparing to see patient, ordering tests and/or medications, and counseling the patient.   Data Reviewed  Labs, recent  Assessment     1. Abnormal uterine bleeding (AUB) (Primary) Rx: - CBC - Comp Met (CMET) - Ferritin - TSH  2. Chlamydia, treated, and negative TOC in May 2025 - has had no sexual activity since treatment  3. Encounter for other general counseling or advice on  contraception - options discussed - wants OCP's  4. Encounter for initial prescription of contraceptive pills Rx: - norethindrone-ethinyl estradiol (CYCLAFEM) 0.5/0.75/1-35 MG-MCG tablet; Take 1 tablet by mouth daily.  Dispense: 28 tablet; Refill: 11  5. Vitamin deficiency Rx: - Prenat-Fe Poly-Methfol-FA-DHA (VITAFOL  ULTRA) 29-0.6-0.4-200 MG CAPS; Take 1 capsule by mouth daily before breakfast.  Dispense: 90 capsule; Refill: 4     Plan   Follow up in 3 months  Orders Placed This Encounter  Procedures   CBC   Comp Met (CMET)   Ferritin   TSH   Meds ordered this encounter  Medications   norethindrone-ethinyl estradiol (CYCLAFEM) 0.5/0.75/1-35 MG-MCG tablet    Sig: Take 1 tablet by mouth daily.    Dispense:  28 tablet    Refill:  11   Prenat-Fe Poly-Methfol-FA-DHA (VITAFOL  ULTRA) 29-0.6-0.4-200 MG CAPS    Sig: Take 1 capsule by mouth daily before breakfast.    Dispense:  90 capsule    Refill:  4    CARLIN RONAL CENTERS, MD, FACOG Attending Obstetrician & Gynecologist, [facility] for [facility], [facility], [place] 04/15/2024

## 2024-04-16 ENCOUNTER — Ambulatory Visit: Payer: Self-pay | Admitting: Family Medicine

## 2024-04-16 LAB — COMPREHENSIVE METABOLIC PANEL WITH GFR
ALT: 12 IU/L (ref 0–32)
AST: 16 IU/L (ref 0–40)
Albumin: 4.6 g/dL (ref 4.0–5.0)
Alkaline Phosphatase: 56 IU/L (ref 42–106)
BUN/Creatinine Ratio: 12 (ref 9–23)
BUN: 8 mg/dL (ref 6–20)
Bilirubin Total: 0.4 mg/dL (ref 0.0–1.2)
CO2: 20 mmol/L (ref 20–29)
Calcium: 9.6 mg/dL (ref 8.7–10.2)
Chloride: 104 mmol/L (ref 96–106)
Creatinine, Ser: 0.68 mg/dL (ref 0.57–1.00)
Globulin, Total: 2.1 g/dL (ref 1.5–4.5)
Glucose: 76 mg/dL (ref 70–99)
Potassium: 4.5 mmol/L (ref 3.5–5.2)
Sodium: 140 mmol/L (ref 134–144)
Total Protein: 6.7 g/dL (ref 6.0–8.5)
eGFR: 129 mL/min/{1.73_m2} (ref >59)

## 2024-04-16 LAB — CBC
Hematocrit: 40.2 % (ref 34.0–46.6)
Hemoglobin: 12.9 g/dL (ref 11.1–15.9)
MCH: 26.9 pg (ref 26.6–33.0)
MCHC: 32.1 g/dL (ref 31.5–35.7)
MCV: 84 fL (ref 79–97)
Platelets: 284 10*3/uL (ref 150–450)
RBC: 4.8 x10E6/uL (ref 3.77–5.28)
RDW: 12.5 % (ref 11.7–15.4)
WBC: 4.2 10*3/uL (ref 3.4–10.8)

## 2024-04-16 LAB — FERRITIN: Ferritin: 60 ng/mL (ref 15–77)

## 2024-04-16 LAB — TSH: TSH: 1.31 u[IU]/mL (ref 0.450–4.500)

## 2024-05-23 ENCOUNTER — Ambulatory Visit (INDEPENDENT_AMBULATORY_CARE_PROVIDER_SITE_OTHER)

## 2024-05-23 VITALS — BP 133/77 | HR 77 | Wt 120.0 lb

## 2024-05-23 DIAGNOSIS — Z3042 Encounter for surveillance of injectable contraceptive: Secondary | ICD-10-CM

## 2024-05-23 LAB — POCT URINE PREGNANCY: Preg Test, Ur: NEGATIVE

## 2024-05-23 MED ORDER — MEDROXYPROGESTERONE ACETATE 150 MG/ML IM SUSP
150.0000 mg | INTRAMUSCULAR | Status: AC
  Administered 2024-05-23: 150 mg via INTRAMUSCULAR

## 2024-05-23 MED ORDER — MEDROXYPROGESTERONE ACETATE 150 MG/ML IM SUSP: 150.0000 mg | INTRAMUSCULAR | 2 refills | Status: DC

## 2024-05-23 NOTE — Progress Notes (Signed)
 Pt is in the office to restart depo. Last injection was on 1/15/205, but pt decided to switch to pills on 6/30 at visit with Dr. Rudy. Pt states that the pills caused her to have digestion issues, and caused her cycle to completely stop, reports the last cycle was in April. Pt would like to switch back to depo today, denies being sexually active without protection. Consulted with Dr Rudy in office and pt approved to start depo today. UPT in office is negative today. Administered depo in [facility] and pt tolerated well. Advised to use backup x1 week. Next depo due Oct 23- Nov 6 .. Administrations This Visit     medroxyPROGESTERone  (DEPO-PROVERA ) injection 150 mg     Admin Date 05/23/2024 Action Given Dose 150 mg Route Intramuscular Documented By Doneta Laymon BIRCH, RN

## 2024-06-29 NOTE — Progress Notes (Signed)
 Subjective Patient ID: Janet Mcconnell is a 18 y.o. female.  Chief Complaint  Patient presents with  . Vomiting    Pt presents with nausea and vomitng since yesterday. She feels like she can't catch her breath. She's feeling SOB. Pt was able to eat yesterday and fluid intake is normal. She has tried taking her inhaler and it doesn't relieve her SOB.   SABRA Abdominal Pain    Abd pain x 2 days Pt states she's been having stomach issues since 3 months ago. Pcp has treated her with omeprazole and has GI appt soon.     The following information was reviewed by members of the visit team:  Tobacco  Allergies  Med Hx  Surg Hx  OB Status      18 year old female presents for evaluation of nausea, vomiting and epigastric discomfort which began 1 day ago.  Also reports some shortness of breath which also began 1 day ago.  Patient states that she frequently has episodes of hyperventilating and feels this may be attributed to her history of asthma.  She has been using asthma inhaler at home without relief of symptoms.  Abdominal pain is described as burning, tight pain that does not radiate.  She has had similar symptoms previously and is awaiting appointment with GI for further evaluation.  She has had a handful of episodes of vomiting since onset.  She denies associated fever, chills.  No cough, congestion, sore throat.  She denies chest pain or discomfort.  No urinary symptoms.  States she has noticed that her stomach problems and breathing problems usually occur at the same time and around episodes of stress.  She is concerned that stress and/or anxiety may be contributing to her symptoms.  States she has been experiencing a lot of stress recently due to her family situation and due to being in school.    Review of Systems  Constitutional:  Negative for chills and fever.  HENT:  Negative for congestion, ear pain, rhinorrhea, sinus pressure, sinus pain and sore throat.   Respiratory:  Positive for  chest tightness and shortness of breath. Negative for cough and wheezing.   Cardiovascular:  Negative for chest pain.  Gastrointestinal:  Positive for abdominal pain, nausea and vomiting. Negative for diarrhea.  Genitourinary:  Negative for dysuria, flank pain and frequency.  Musculoskeletal:  Negative for arthralgias, myalgias, neck pain and neck stiffness.  Skin:  Negative for rash.  Neurological:  Negative for dizziness and headaches.  Hematological:  Negative for adenopathy. Does not bruise/bleed easily.    Objective Physical Exam Vitals reviewed.  Constitutional:      General: She is not in acute distress.    Appearance: Normal appearance. She is not ill-appearing, toxic-appearing or diaphoretic.  HENT:     Right Ear: Tympanic membrane, ear canal and external ear normal.     Left Ear: Tympanic membrane, ear canal and external ear normal.     Nose: Nose normal. No congestion or rhinorrhea.     Mouth/Throat:     Mouth: Mucous membranes are moist.     Pharynx: No oropharyngeal exudate or posterior oropharyngeal erythema.  Eyes:     Conjunctiva/sclera: Conjunctivae normal.  Cardiovascular:     Rate and Rhythm: Normal rate and regular rhythm.  Pulmonary:     Effort: Pulmonary effort is normal. No respiratory distress.     Breath sounds: Normal breath sounds. No stridor. No wheezing, rhonchi or rales.  Abdominal:     General: Abdomen is flat.  Bowel sounds are normal. There is no distension.     Palpations: Abdomen is soft. There is no mass.     Tenderness: There is abdominal tenderness (Mild epigastric). There is no guarding or rebound.     Hernia: No hernia is present.  Musculoskeletal:     Cervical back: Normal range of motion and neck supple. No rigidity.  Skin:    General: Skin is warm and dry.     Capillary Refill: Capillary refill takes less than 2 seconds.     Findings: No rash.  Neurological:     Mental Status: She is alert and oriented to person, place, and time.   Psychiatric:        Mood and Affect: Mood normal.    XR Chest 2 Views  Final Result by Baldwin Rome Dallas Mina, MD (09/13 1011)  XR CHEST 2 VIEWS 06/29/2024 10:07 AM    INDICATION: shortness of breath, Shortness of breath \ R06.02 Shortness of   breath   COMPARISON: None    FINDINGS:    .  Cardiovascular/Mediastinum: The cardiomediastinal silhouette is within   normal limits.  .  Lungs/Pleura: No focal consolidation. No pleural effusion. No   pneumothorax.  SABRA  Upper abdomen: Unremarkable.  .  Osseous structures and soft tissues: Unremarkable.    IMPRESSION:  No radiographic evidence of acute cardiopulmonary abnormality.      Assessment/Plan  18 year old female with 1 day history of nausea, vomiting, upper abdominal pain along with shortness of breath. DDx: Ulcer disease, GERD, gallbladder disease, pancreatitis, asthma, pneumonia, anxiety, stress X-ray of the chest was performed due to complaint of shortness of breath.  It shows no acute cardiopulmonary abnormality Lung sounds are clear on exam there is no wheezing or obvious signs of asthma exacerbation Patient was given 4 mg ODT for nausea in clinic.  She was also given GI cocktail for epigastric symptoms Patient states she is feeling better after medications Abdominal pain could be due to ulcer disease/GERD.  She has minimal tenderness to palpation on exam, doubt acute abdomen.  Vital signs are within normal limits, patient afebrile.  She appears to be in no acute distress Also discussed with patient that symptoms may be related to stress or anxiety.  Will provide referral to behavioral health for further evaluation Will also prescribe hydroxyzine to use as needed for anxiety.  Also prescribe Zofran  for nausea at home Follow-up as scheduled with GI.  Follow with PCP as well Go to ER as needed for worsening abdominal pain, worsening shortness of breath or develop chest pain or fever or other worsening symptoms. Urgent  Care Disposition:  Home Care   Electronically signed: Franky Floria Finder, PA-C 06/29/2024  9:51 AM

## 2024-07-02 ENCOUNTER — Other Ambulatory Visit: Payer: Self-pay

## 2024-07-02 ENCOUNTER — Emergency Department (HOSPITAL_COMMUNITY)

## 2024-07-02 ENCOUNTER — Encounter (HOSPITAL_COMMUNITY): Payer: Self-pay

## 2024-07-02 ENCOUNTER — Emergency Department (HOSPITAL_COMMUNITY)
Admission: EM | Admit: 2024-07-02 | Discharge: 2024-07-03 | Discharge status: Against Medical Advice | Attending: Emergency Medicine | Admitting: Emergency Medicine

## 2024-07-02 DIAGNOSIS — R109 Unspecified abdominal pain: Secondary | ICD-10-CM | POA: Insufficient documentation

## 2024-07-02 DIAGNOSIS — Z5321 Procedure and treatment not carried out due to patient leaving prior to being seen by health care provider: Secondary | ICD-10-CM | POA: Diagnosis not present

## 2024-07-02 DIAGNOSIS — R531 Weakness: Secondary | ICD-10-CM | POA: Diagnosis not present

## 2024-07-02 DIAGNOSIS — R112 Nausea with vomiting, unspecified: Secondary | ICD-10-CM | POA: Diagnosis present

## 2024-07-02 LAB — COMPREHENSIVE METABOLIC PANEL WITH GFR
ALT: 13 U/L (ref 0–44)
AST: 20 U/L (ref 15–41)
Albumin: 4.6 g/dL (ref 3.5–5.0)
Alkaline Phosphatase: 44 U/L (ref 38–126)
Anion gap: 15 (ref 5–15)
BUN: 6 mg/dL (ref 6–20)
CO2: 18 mmol/L — ABNORMAL LOW (ref 22–32)
Calcium: 9.7 mg/dL (ref 8.9–10.3)
Chloride: 105 mmol/L (ref 98–111)
Creatinine, Ser: 0.9 mg/dL (ref 0.44–1.00)
GFR, Estimated: >60 mL/min (ref >60)
Glucose, Bld: 107 mg/dL — ABNORMAL HIGH (ref 70–99)
Potassium: 3.6 mmol/L (ref 3.5–5.1)
Sodium: 138 mmol/L (ref 135–145)
Total Bilirubin: 0.8 mg/dL (ref 0.0–1.2)
Total Protein: 7.5 g/dL (ref 6.5–8.1)

## 2024-07-02 LAB — CBC WITH DIFFERENTIAL/PLATELET
Abs Immature Granulocytes: 0.02 K/uL (ref 0.00–0.07)
Basophils Absolute: 0 K/uL (ref 0.0–0.1)
Basophils Relative: 0 %
Eosinophils Absolute: 0 K/uL (ref 0.0–0.5)
Eosinophils Relative: 0 %
HCT: 42.6 % (ref 36.0–46.0)
Hemoglobin: 14.3 g/dL (ref 12.0–15.0)
Immature Granulocytes: 0 %
Lymphocytes Relative: 35 %
Lymphs Abs: 2.9 K/uL (ref 0.7–4.0)
MCH: 26.2 pg (ref 26.0–34.0)
MCHC: 33.6 g/dL (ref 30.0–36.0)
MCV: 78.2 fL — ABNORMAL LOW (ref 80.0–100.0)
Monocytes Absolute: 0.7 K/uL (ref 0.1–1.0)
Monocytes Relative: 8 %
Neutro Abs: 4.8 K/uL (ref 1.7–7.7)
Neutrophils Relative %: 57 %
Platelets: 350 K/uL (ref 150–400)
RBC: 5.45 MIL/uL — ABNORMAL HIGH (ref 3.87–5.11)
RDW: 12.6 % (ref 11.5–15.5)
WBC: 8.5 K/uL (ref 4.0–10.5)
nRBC: 0 % (ref 0.0–0.2)

## 2024-07-02 LAB — LIPASE, BLOOD: Lipase: 27 U/L (ref 11–51)

## 2024-07-02 LAB — URINALYSIS, ROUTINE W REFLEX MICROSCOPIC
Bilirubin Urine: NEGATIVE
Glucose, UA: NEGATIVE mg/dL
Hgb urine dipstick: NEGATIVE
Ketones, ur: 20 mg/dL — AB
Leukocytes,Ua: NEGATIVE
Nitrite: NEGATIVE
Protein, ur: 100 mg/dL — AB
Specific Gravity, Urine: 1.013 (ref 1.005–1.030)
pH: 8 (ref 5.0–8.0)

## 2024-07-02 LAB — TROPONIN I (HIGH SENSITIVITY)
Troponin I (High Sensitivity): <2 ng/L (ref <18)
Troponin I (High Sensitivity): <2 ng/L (ref <18)

## 2024-07-02 LAB — HCG, SERUM, QUALITATIVE: Preg, Serum: NEGATIVE

## 2024-07-02 MED ORDER — LORAZEPAM 0.5 MG PO TABS
0.5000 mg | ORAL_TABLET | Freq: Once | ORAL | Status: AC
  Administered 2024-07-02: 0.5 mg via ORAL
  Filled 2024-07-02: qty 1

## 2024-07-02 NOTE — ED Provider Triage Note (Signed)
 Emergency Medicine Provider Triage Evaluation Note  Janet Mcconnell , a 18 y.o. female  was evaluated in triage.  Pt complains of abdominal pain radiating up to her chest associate with nausea and vomiting for 4 days.  Has tried Zofran  and other medicines given by urgent care.  Review of Systems  Positive: Abdominal pain Negative: CP  Physical Exam  BP (!) 128/97   Pulse (!) 112   Temp 97.9 F (36.6 C)   Resp 16   SpO2 100%  Gen:   Awake, no distress , crying Resp:  Normal effort  MSK:   Moves extremities without difficulty  Other:    Medical Decision Making  Medically screening exam initiated at 7:05 PM.  Appropriate orders placed.  Arynn Armand was informed that the remainder of the evaluation will be completed by another provider, this initial triage assessment does not replace that evaluation, and the importance of remaining in the ED until their evaluation is complete.     Shermon Warren SAILOR, [place] 07/02/24 1907

## 2024-07-02 NOTE — ED Triage Notes (Signed)
 Pt has had nausea and vomiting and is feeling weak and faint

## 2024-07-02 NOTE — ED Triage Notes (Signed)
 Pt came in via POV d/t n/v the past 4 days & not able to hold anything down. Also reports that when she wakes up at night she feels like she is having acid reflux & like her chest is getting tight & was given anxiety at meds from UC & denies any of that working. A/Ox4, reports 8/10 constricting feelings on the center of her chest.

## 2024-07-02 NOTE — ED Notes (Signed)
 I tried to convince patient to stay but she refused. Taking her OTF

## 2024-07-22 ENCOUNTER — Ambulatory Visit (INDEPENDENT_AMBULATORY_CARE_PROVIDER_SITE_OTHER)

## 2024-07-22 ENCOUNTER — Other Ambulatory Visit (HOSPITAL_COMMUNITY)
Admission: RE | Admit: 2024-07-22 | Discharge: 2024-07-22 | Disposition: A | Discharge status: Home / Self Care | Source: Ambulatory Visit | Attending: Obstetrics and Gynecology | Admitting: Obstetrics and Gynecology

## 2024-07-22 VITALS — BP 123/83 | HR 76

## 2024-07-22 DIAGNOSIS — Z113 Encounter for screening for infections with a predominantly sexual mode of transmission: Secondary | ICD-10-CM | POA: Diagnosis present

## 2024-07-22 DIAGNOSIS — R109 Unspecified abdominal pain: Secondary | ICD-10-CM

## 2024-07-22 LAB — POCT URINALYSIS DIPSTICK
Bilirubin, UA: NEGATIVE
Glucose, UA: NEGATIVE
Protein, UA: NEGATIVE
Spec Grav, UA: 1.02 (ref 1.010–1.025)
Urobilinogen, UA: 0.2 U/dL
pH, UA: 6.5 (ref 5.0–8.0)

## 2024-07-22 NOTE — Progress Notes (Signed)
 SUBJECTIVE:  18 y.o. female who desires a STI screen. Denies abnormal vaginal discharge, bleeding or significant pelvic pain. No UTI symptoms. Denies history of known exposure to STD.  No LMP recorded. Patient has had an injection.  OBJECTIVE:  She appears well.   ASSESSMENT:  STI Screen   PLAN:  Pt offered STI blood screening-requested GC, chlamydia, and trichomonas probe sent to lab.  Treatment: To be determined once lab results are received.  Pt follow up as needed.  SUBJECTIVE: Janet Mcconnell is a 18 y.o. female who complains of abdominal cramps similar to UTI x 2 days, without flank pain, fever, chills, or abnormal vaginal discharge or bleeding.   OBJECTIVE: Appears well, in no apparent distress.  Vital signs are normal. Urine dipstick shows positive for nitrates and positive for ketones.    ASSESSMENT: Dysuria  PLAN: Treatment per orders.  Call or return to clinic prn if these symptoms worsen or fail to improve as anticipated.

## 2024-07-23 LAB — RPR: RPR Ser Ql: NONREACTIVE

## 2024-07-23 LAB — HEPATITIS B SURFACE ANTIGEN: Hepatitis B Surface Ag: NEGATIVE

## 2024-07-23 LAB — HEPATITIS C ANTIBODY: Hep C Virus Ab: NONREACTIVE

## 2024-07-23 LAB — HIV ANTIBODY (ROUTINE TESTING W REFLEX): HIV Screen 4th Generation wRfx: NONREACTIVE

## 2024-07-24 LAB — URINE CULTURE

## 2024-07-29 LAB — CERVICOVAGINAL ANCILLARY ONLY
Bacterial Vaginitis (gardnerella): NEGATIVE
Candida Glabrata: NEGATIVE
Candida Vaginitis: NEGATIVE
Chlamydia: NEGATIVE
Comment: NEGATIVE
Comment: NEGATIVE
Comment: NEGATIVE
Comment: NEGATIVE
Comment: NEGATIVE
Comment: NORMAL
Neisseria Gonorrhea: NEGATIVE
Trichomonas: NEGATIVE

## 2024-08-08 ENCOUNTER — Ambulatory Visit

## 2024-08-12 ENCOUNTER — Ambulatory Visit

## 2024-08-12 DIAGNOSIS — Z30013 Encounter for initial prescription of injectable contraceptive: Secondary | ICD-10-CM | POA: Diagnosis not present

## 2024-08-12 MED ORDER — MEDROXYPROGESTERONE ACETATE 150 MG/ML IM SUSP
150.0000 mg | Freq: Once | INTRAMUSCULAR | Status: AC
  Administered 2024-08-12: 150 mg via INTRAMUSCULAR

## 2024-08-12 NOTE — Progress Notes (Signed)
 Date last pap: N/A due to age. Last Depo-Provera : 05/23/24. Side Effects if any: NA. Serum HCG indicated? NA. Depo-Provera  150 mg IM given by: Duwaine Galla, RN in [facility]. Next appointment due Jan 12-26 2026.

## 2024-10-21 ENCOUNTER — Encounter (HOSPITAL_BASED_OUTPATIENT_CLINIC_OR_DEPARTMENT_OTHER): Payer: Self-pay | Admitting: *Deleted

## 2024-10-21 ENCOUNTER — Other Ambulatory Visit: Payer: Self-pay

## 2024-10-21 ENCOUNTER — Emergency Department (HOSPITAL_BASED_OUTPATIENT_CLINIC_OR_DEPARTMENT_OTHER): Admitting: Radiology

## 2024-10-21 DIAGNOSIS — Z5321 Procedure and treatment not carried out due to patient leaving prior to being seen by health care provider: Secondary | ICD-10-CM | POA: Insufficient documentation

## 2024-10-21 DIAGNOSIS — M79675 Pain in left toe(s): Secondary | ICD-10-CM | POA: Diagnosis present

## 2024-10-21 DIAGNOSIS — R200 Anesthesia of skin: Secondary | ICD-10-CM | POA: Diagnosis not present

## 2024-10-21 NOTE — ED Triage Notes (Signed)
 Pt states that her left great toe was painful for 2 weeks.  She was seen at [facility] as she was unsure what was causing this.  Pain is now gone except for when she walks on it a lot.  Pt has numbness to toe now.  Not associated with any trauma. Toe is not red of swollen, it looks like it has been wet in her shoes.  She works as a transport planner.

## 2024-10-22 ENCOUNTER — Emergency Department (HOSPITAL_BASED_OUTPATIENT_CLINIC_OR_DEPARTMENT_OTHER)
Admission: EM | Admit: 2024-10-22 | Discharge: 2024-10-22 | Discharge status: Against Medical Advice | Attending: Emergency Medicine | Admitting: Emergency Medicine

## 2024-10-22 ENCOUNTER — Ambulatory Visit (INDEPENDENT_AMBULATORY_CARE_PROVIDER_SITE_OTHER)

## 2024-10-22 ENCOUNTER — Other Ambulatory Visit (HOSPITAL_COMMUNITY)
Admission: RE | Admit: 2024-10-22 | Discharge: 2024-10-22 | Disposition: A | Discharge status: Home / Self Care | Source: Ambulatory Visit | Attending: Obstetrics and Gynecology | Admitting: Obstetrics and Gynecology

## 2024-10-22 VITALS — BP 114/72 | HR 87 | Wt 117.0 lb

## 2024-10-22 DIAGNOSIS — R3911 Hesitancy of micturition: Secondary | ICD-10-CM | POA: Diagnosis not present

## 2024-10-22 DIAGNOSIS — N898 Other specified noninflammatory disorders of vagina: Secondary | ICD-10-CM | POA: Diagnosis present

## 2024-10-22 DIAGNOSIS — R300 Dysuria: Secondary | ICD-10-CM

## 2024-10-22 DIAGNOSIS — E569 Vitamin deficiency, unspecified: Secondary | ICD-10-CM

## 2024-10-22 LAB — POCT URINALYSIS DIPSTICK
Bilirubin, UA: NEGATIVE
Blood, UA: NEGATIVE
Glucose, UA: NEGATIVE
Nitrite, UA: NEGATIVE
Odor: NEGATIVE
Protein, UA: POSITIVE — AB
Spec Grav, UA: <=1.005 — AB
Urobilinogen, UA: 0.2 U/dL
pH, UA: 8

## 2024-10-22 MED ORDER — VITAFOL ULTRA 29-0.6-0.4-200 MG PO CAPS: 1.0000 | ORAL_CAPSULE | Freq: Every day | ORAL | 4 refills | Status: AC

## 2024-10-22 MED ORDER — MEDROXYPROGESTERONE ACETATE 150 MG/ML IM SUSP: 150.0000 mg | INTRAMUSCULAR | 1 refills | Status: AC

## 2024-10-22 NOTE — Progress Notes (Signed)
..  SUBJECTIVE:  19 y.o. female complains of white vaginal discharge and odor for a few day(s). She also reports urgency to urinate with delayed urine flow. Denies abnormal vaginal bleeding or significant pelvic pain or fever. Denies history of known exposure to STD.  No LMP recorded. Patient has had an injection.  OBJECTIVE:  She appears well, afebrile. Urine dipstick: positive for glucose, positive for leukocytes, and positive for ketones.  ASSESSMENT:  Vaginal Discharge  Vaginal Odor    PLAN:  GC, chlamydia, trichomonas, BVAG, CVAG probe and urine culture sent to lab. Treatment: To be determined once lab results are received ROV prn if symptoms persist or worsen.

## 2024-10-22 NOTE — ED Notes (Signed)
No answer when called for treatment room.  ?

## 2024-10-23 ENCOUNTER — Ambulatory Visit: Payer: Self-pay | Admitting: Obstetrics and Gynecology

## 2024-10-23 LAB — SYPHILIS: RPR W/REFLEX TO RPR TITER AND TREPONEMAL ANTIBODIES, TRADITIONAL SCREENING AND DIAGNOSIS ALGORITHM: RPR Ser Ql: NONREACTIVE

## 2024-10-23 LAB — HIV ANTIBODY (ROUTINE TESTING W REFLEX): HIV Screen 4th Generation wRfx: NONREACTIVE

## 2024-10-23 LAB — CERVICOVAGINAL ANCILLARY ONLY
Bacterial Vaginitis (gardnerella): NEGATIVE
Candida Glabrata: NEGATIVE
Candida Vaginitis: NEGATIVE
Chlamydia: NEGATIVE
Comment: NEGATIVE
Comment: NEGATIVE
Comment: NEGATIVE
Comment: NEGATIVE
Comment: NEGATIVE
Comment: NORMAL
Neisseria Gonorrhea: NEGATIVE
Trichomonas: NEGATIVE

## 2024-10-23 LAB — HEPATITIS B SURFACE ANTIGEN: Hepatitis B Surface Ag: NEGATIVE

## 2024-10-23 LAB — HEPATITIS C ANTIBODY: Hep C Virus Ab: NONREACTIVE

## 2024-10-24 LAB — URINE CULTURE: Organism ID, Bacteria: NO GROWTH

## 2024-10-28 ENCOUNTER — Ambulatory Visit

## 2024-10-28 VITALS — BP 115/80 | HR 99 | Ht 66.0 in | Wt 114.7 lb

## 2024-10-28 DIAGNOSIS — Z3042 Encounter for surveillance of injectable contraceptive: Secondary | ICD-10-CM | POA: Diagnosis not present

## 2024-10-28 MED ORDER — MEDROXYPROGESTERONE ACETATE 150 MG/ML IM SUSY
1.0000 mL | PREFILLED_SYRINGE | Freq: Once | INTRAMUSCULAR | Status: AC
  Administered 2024-10-28: 150 mg via INTRAMUSCULAR

## 2024-10-28 NOTE — Progress Notes (Signed)
 Date last pap: N/A due to age. Last Depo-Provera : 08/12/2024. Side Effects if any: none reported. Serum HCG indicated? No Depo-Provera  150 mg IM given by: Orlene Deidra Bang, RN Next appointment due 3/30-4/13.

## 2025-01-13 ENCOUNTER — Ambulatory Visit: Payer: Self-pay
